# Patient Record
Sex: Female | Born: 1990 | Race: Black or African American | Hispanic: No | Marital: Single | Smoking: Never smoker
Health system: Southern US, Community
[De-identification: ages and names within clinical notes are randomized; demographics above are authoritative.]

## PROBLEM LIST (undated history)

## (undated) DIAGNOSIS — J40 Bronchitis, not specified as acute or chronic: Secondary | ICD-10-CM

---
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
## 2017-03-05 ENCOUNTER — Emergency Department (HOSPITAL_COMMUNITY)
Admission: EM | Admit: 2017-03-05 | Discharge: 2017-03-05 | Disposition: A | Discharge status: Home / Self Care | Payer: BLUE CROSS/BLUE SHIELD | Attending: Emergency Medicine | Admitting: Emergency Medicine

## 2017-03-05 ENCOUNTER — Emergency Department (HOSPITAL_COMMUNITY): Payer: BLUE CROSS/BLUE SHIELD

## 2017-03-05 ENCOUNTER — Encounter (HOSPITAL_COMMUNITY): Payer: Self-pay | Admitting: Emergency Medicine

## 2017-03-05 DIAGNOSIS — Z79899 Other long term (current) drug therapy: Secondary | ICD-10-CM | POA: Insufficient documentation

## 2017-03-05 DIAGNOSIS — M25551 Pain in right hip: Secondary | ICD-10-CM | POA: Diagnosis present

## 2017-03-05 HISTORY — DX: Bronchitis, not specified as acute or chronic: J40

## 2017-03-05 LAB — I-STAT BETA HCG BLOOD, ED (MC, WL, AP ONLY): I-stat hCG, quantitative: <5 m[IU]/mL (ref <5)

## 2017-03-05 MED ORDER — CYCLOBENZAPRINE HCL 10 MG PO TABS: 10.0000 mg | ORAL_TABLET | Freq: Two times a day (BID) | ORAL | 0 refills | Status: AC | PRN

## 2017-03-05 MED ORDER — NAPROXEN 500 MG PO TABS
500.0000 mg | ORAL_TABLET | Freq: Once | ORAL | Status: AC
  Administered 2017-03-05: 500 mg via ORAL
  Filled 2017-03-05: qty 1

## 2017-03-05 MED ORDER — MELOXICAM 7.5 MG PO TABS: 7.5000 mg | ORAL_TABLET | Freq: Every day | ORAL | 0 refills | Status: AC

## 2017-03-05 MED ORDER — CYCLOBENZAPRINE HCL 10 MG PO TABS
5.0000 mg | ORAL_TABLET | Freq: Once | ORAL | Status: AC
  Administered 2017-03-05: 5 mg via ORAL
  Filled 2017-03-05: qty 1

## 2017-03-05 NOTE — ED Notes (Signed)
ED Provider at bedside. 

## 2017-03-05 NOTE — ED Triage Notes (Signed)
Patient complaining of left hip pain. Patient states she stood up and it felt like her hip popped out of place yesterday. She has did stretches and it is still is in pain. Patient has not had any injury to hip.

## 2017-03-05 NOTE — ED Provider Notes (Signed)
[facility]-EMERGENCY DEPT Provider Note   CSN: [id number] Arrival date & time: 03/05/17  1935   History   Chief Complaint Chief Complaint  Patient presents with  . Hip Pain    HPI Gabriella Mckenzie is a 26 y.o. female who presents to the Emergency Department with sudden onset, non-radiating left hip pain that began yesterday afternoon when she changed from a sitting to standing position. She denies hearing a "pop". She reports she was ambulatory after the pain began, but came to the ED for evaluation because she is concerned that her hip might be dislocated. She denies left knee and ankle, right hip pain, fever, chills, back and abdominal pain. She denies a h/o of previous injury to the left hip. No previous surgeries. No chronic health problems or daily medications. NKA. She is a never smoker.   HPI  Past Medical History:  Diagnosis Date  . Bronchitis     There are no active problems to display for this patient.   History reviewed. No pertinent surgical history.  OB History    No data available     Home Medications    Prior to Admission medications   Medication Sig Start Date End Date Taking? Authorizing Provider  cyclobenzaprine (FLEXERIL) 10 MG tablet Take 1 tablet (10 mg total) by mouth 2 (two) times daily as needed for muscle spasms. 03/05/17   Annice Jolly A Linda Biehn, PA-C  meloxicam (MOBIC) 7.5 MG tablet Take 1 tablet (7.5 mg total) by mouth daily. 03/05/17   Teasha Murrillo A Fredick Schlosser, PA-C    Family History History reviewed. No pertinent family history.  Social History Social History  Substance Use Topics  . Smoking status: Never Smoker  . Smokeless tobacco: Never Used  . Alcohol use No   Allergies   Patient has no known allergies.  Review of Systems Review of Systems  Constitutional: Negative for chills and fever.  HENT: Negative for congestion.   Respiratory: Negative for shortness of breath.   Cardiovascular: Negative for chest pain.  Gastrointestinal: Negative for abdominal pain.    Genitourinary: Negative for decreased urine volume and dysuria.  Musculoskeletal: Positive for arthralgias, gait problem and myalgias. Negative for joint swelling.  Allergic/Immunologic: Negative for immunocompromised state.  Neurological: Negative for headaches.  Psychiatric/Behavioral: Negative for confusion.   Physical Exam Updated Vital Signs BP 126/72 (BP Location: Right Arm)   Pulse 88   Temp 99.2 F (37.3 C) (Oral)   Resp 16   Ht 5' 4.75" (1.645 m)   Wt 83.9 kg   LMP 02/06/2017   SpO2 99%   BMI 31.02 kg/m   Physical Exam  Constitutional: She is oriented to person, place, and time. She appears well-developed and well-nourished.  HENT:  Head: Normocephalic and atraumatic.  Neck: Normal range of motion.  Abdominal: Soft.  Musculoskeletal: Normal range of motion. She exhibits tenderness. She exhibits no deformity.       Left hip: She exhibits tenderness. She exhibits normal range of motion, normal strength, no bony tenderness, no swelling, no deformity and no laceration.  Point tenderness to palpation over the Greater Trochanteric bursa on the left. Full ROM of the left and right hips, left knee and left ankle. Neurovascularly intact.   Neurological: She is alert and oriented to person, place, and time. Coordination and gait normal.  Skin: Skin is warm and dry. No rash noted. No erythema.  Nursing note and vitals reviewed.    ED Treatments / Results  Labs (all labs ordered are listed, but only abnormal  results are displayed) Labs Reviewed  I-STAT BETA HCG BLOOD, ED ([facility], [facility], [facility] ONLY)    EKG  EKG Interpretation None       Radiology Dg Hip Unilat With Pelvis 2-3 Views Left  Result Date: 03/05/2017 CLINICAL DATA:  LEFT hip pain, states she she stood up and it felt like it popped out of place, persistent pain despite stretching EXAM: DG HIP (WITH OR WITHOUT PELVIS) 2-3V LEFT COMPARISON:  None FINDINGS: Osseous mineralization normal. Hip and SI joints symmetric and  preserved. No acute fracture, dislocation, or bone destruction. IMPRESSION: Normal exam. Electronically Signed   By: Ulyses Southward M.D.   On: 03/05/2017 21:39    Procedures Procedures (including critical care time)  Medications Ordered in ED Medications  naproxen (NAPROSYN) tablet 500 mg (500 mg Oral Given 03/05/17 2222)  cyclobenzaprine (FLEXERIL) tablet 5 mg (5 mg Oral Given 03/05/17 2222)   Initial Impression / Assessment and Plan / ED Course  I have reviewed the triage vital signs and the nursing notes.  Pertinent labs & imaging results that were available during my care of the patient were reviewed by me and considered in my medical decision making (see chart for details).     Patient with left hip pain. Physical exam consistent with trochanteric bursitis. Patient can walk but states is painful. X-rays are negative for fracture or other acute injury. No concern for septic joint. RICE protocol and antiinflammatory medicine indicated and discussed with patient. [place] and will follow-up with her primary care physician once she returns home.   Final Clinical Impressions(s) / ED Diagnoses   Final diagnoses:  Right hip pain   New Prescriptions Discharge Medication List as of 03/05/2017 10:11 PM    START taking these medications   Details  cyclobenzaprine (FLEXERIL) 10 MG tablet Take 1 tablet (10 mg total) by mouth 2 (two) times daily as needed for muscle spasms., Starting Sun 03/05/2017, Print    meloxicam (MOBIC) 7.5 MG tablet Take 1 tablet (7.5 mg total) by mouth daily., Starting Sun 03/05/2017, Print         Elain Wixon A Marisel Tostenson, PA-C 03/06/17 0140    Vanetta Mulders, MD 03/08/17 [phone]

## 2017-03-05 NOTE — ED Notes (Signed)
Pt ambulatory and independent at discharge.  Verbalized understanding of discharge instructions 

## 2018-07-13 IMAGING — CR DG HIP (WITH OR WITHOUT PELVIS) 2-3V*L*
3 series · 3 of 3 positions shown · non-contrast
Comparison: None

CLINICAL DATA: LEFT hip pain, states she she stood up and it felt
like it popped out of place, persistent pain despite stretching

EXAM:
DG HIP (WITH OR WITHOUT PELVIS) 2-3V LEFT

[t pelvis ap]
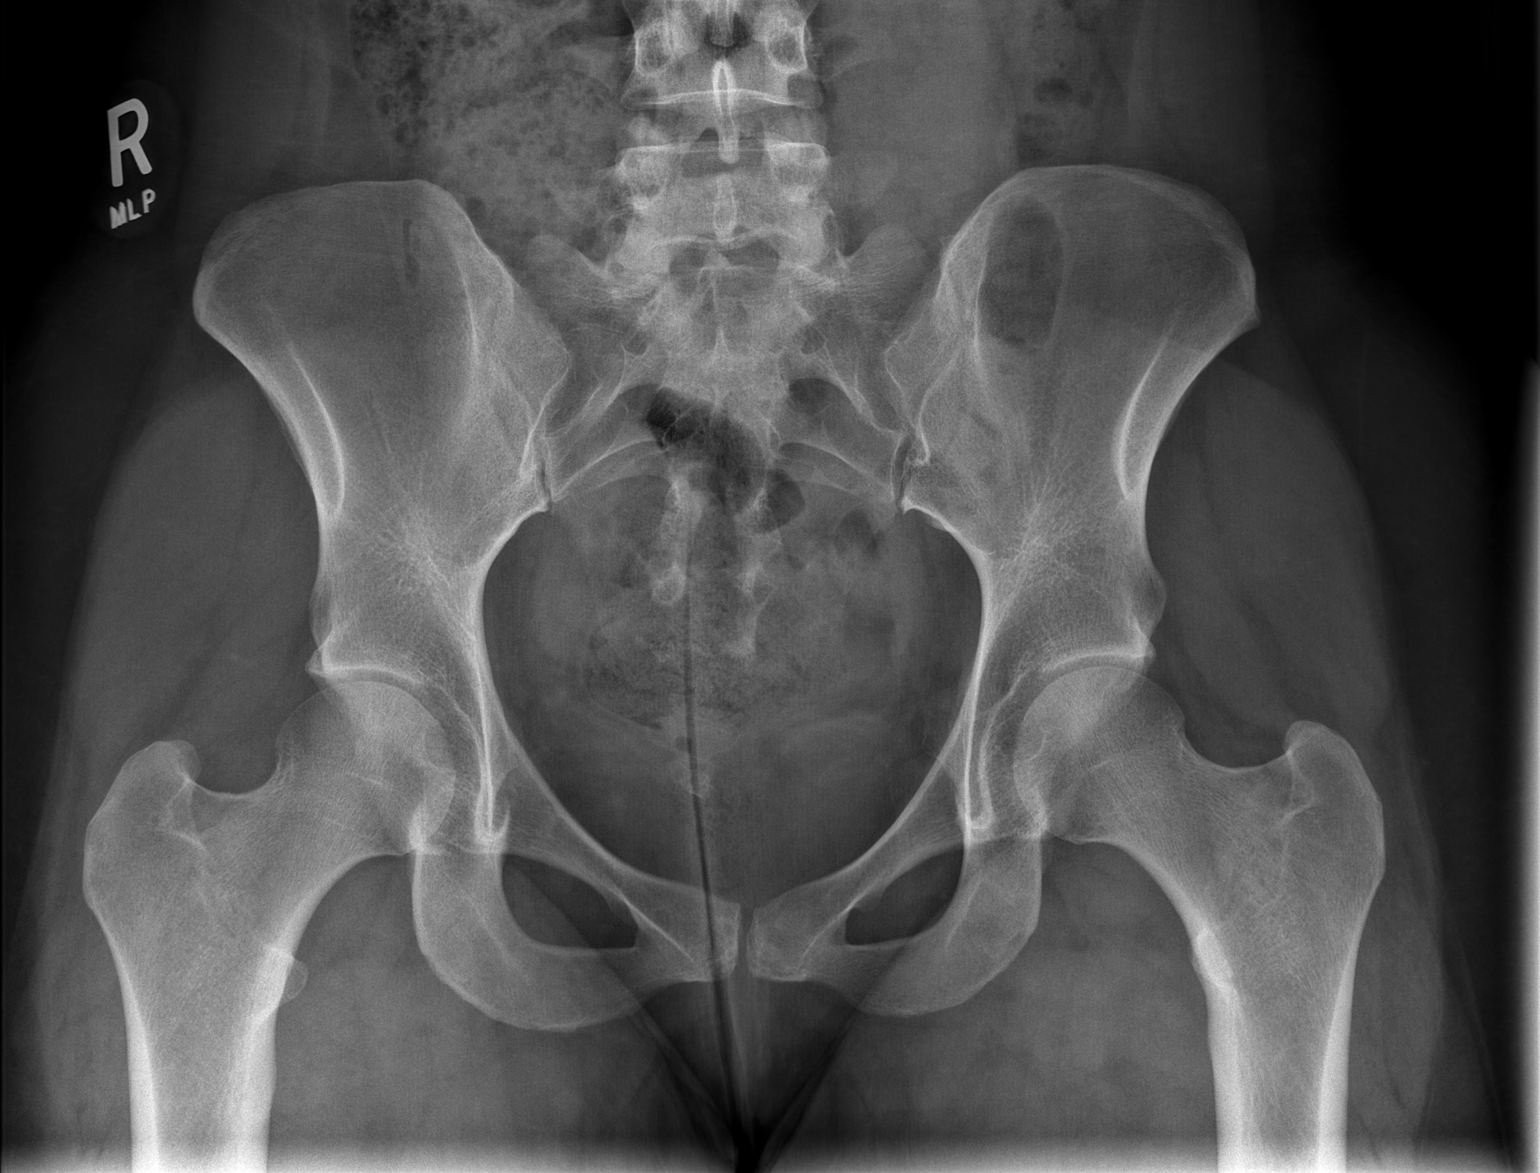

[t hip ap left]
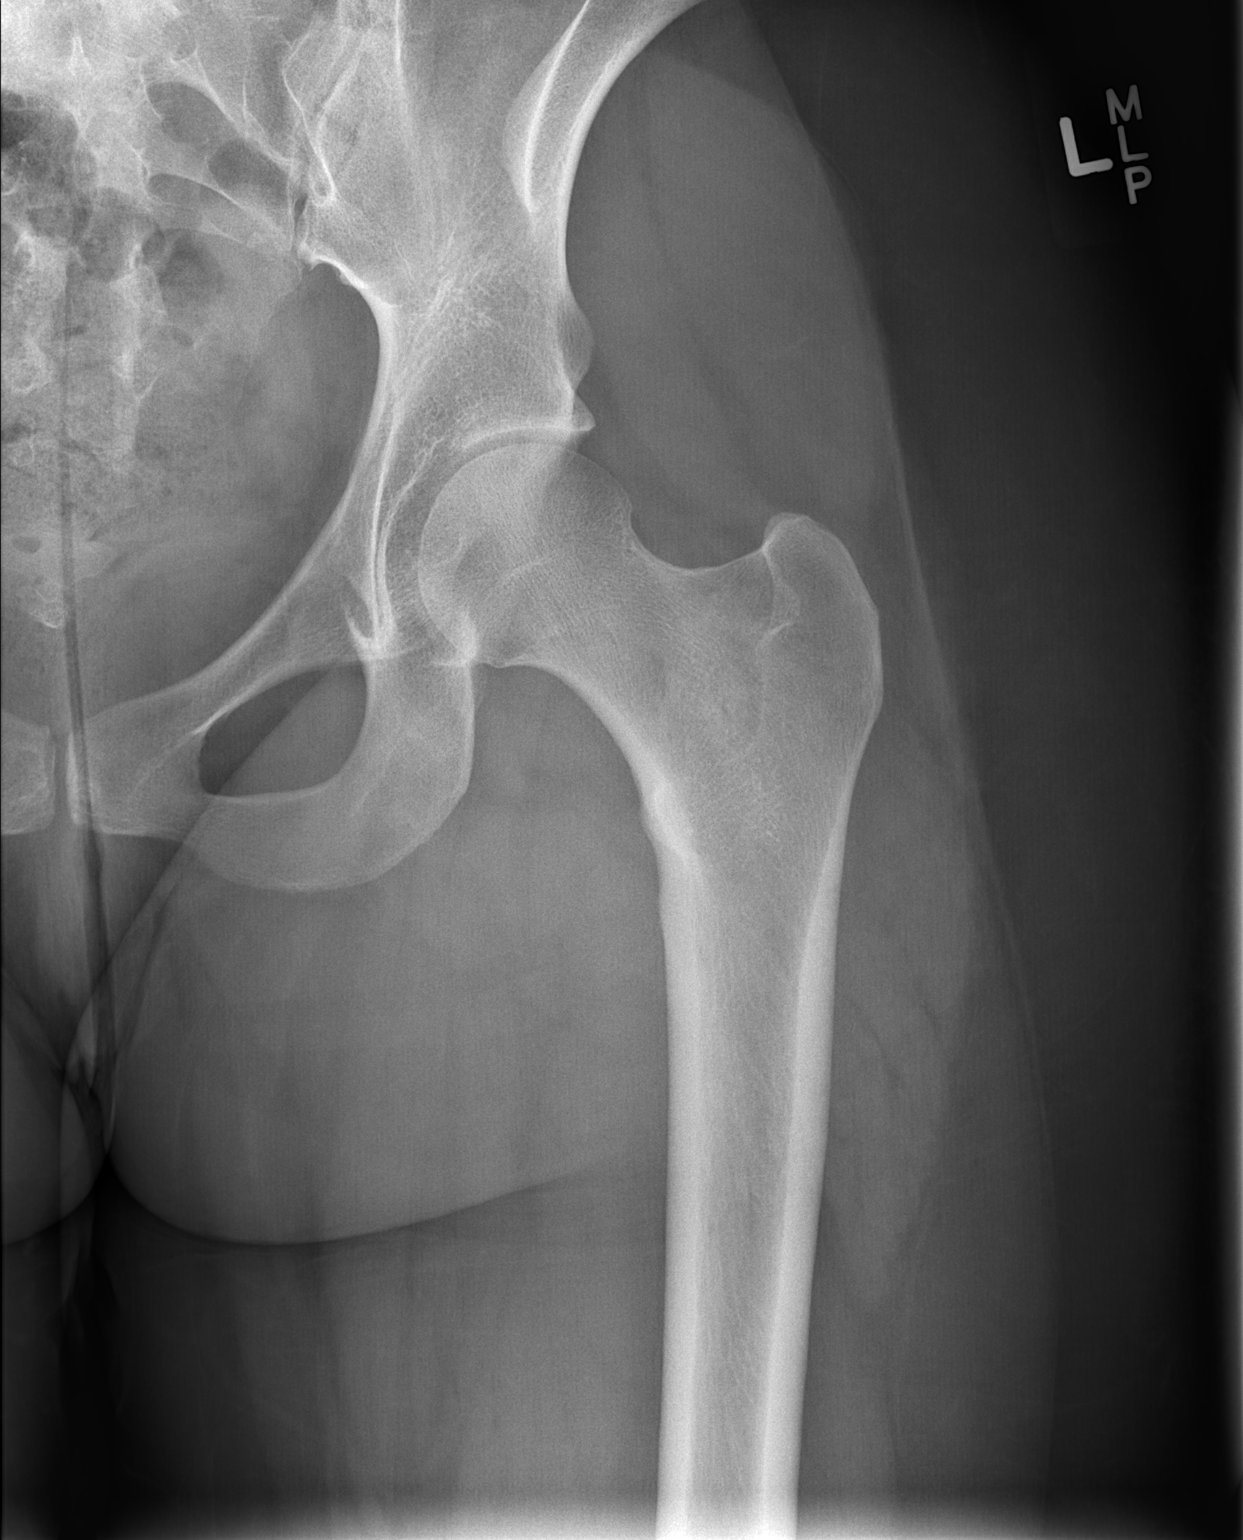

[t hip frog leg left]
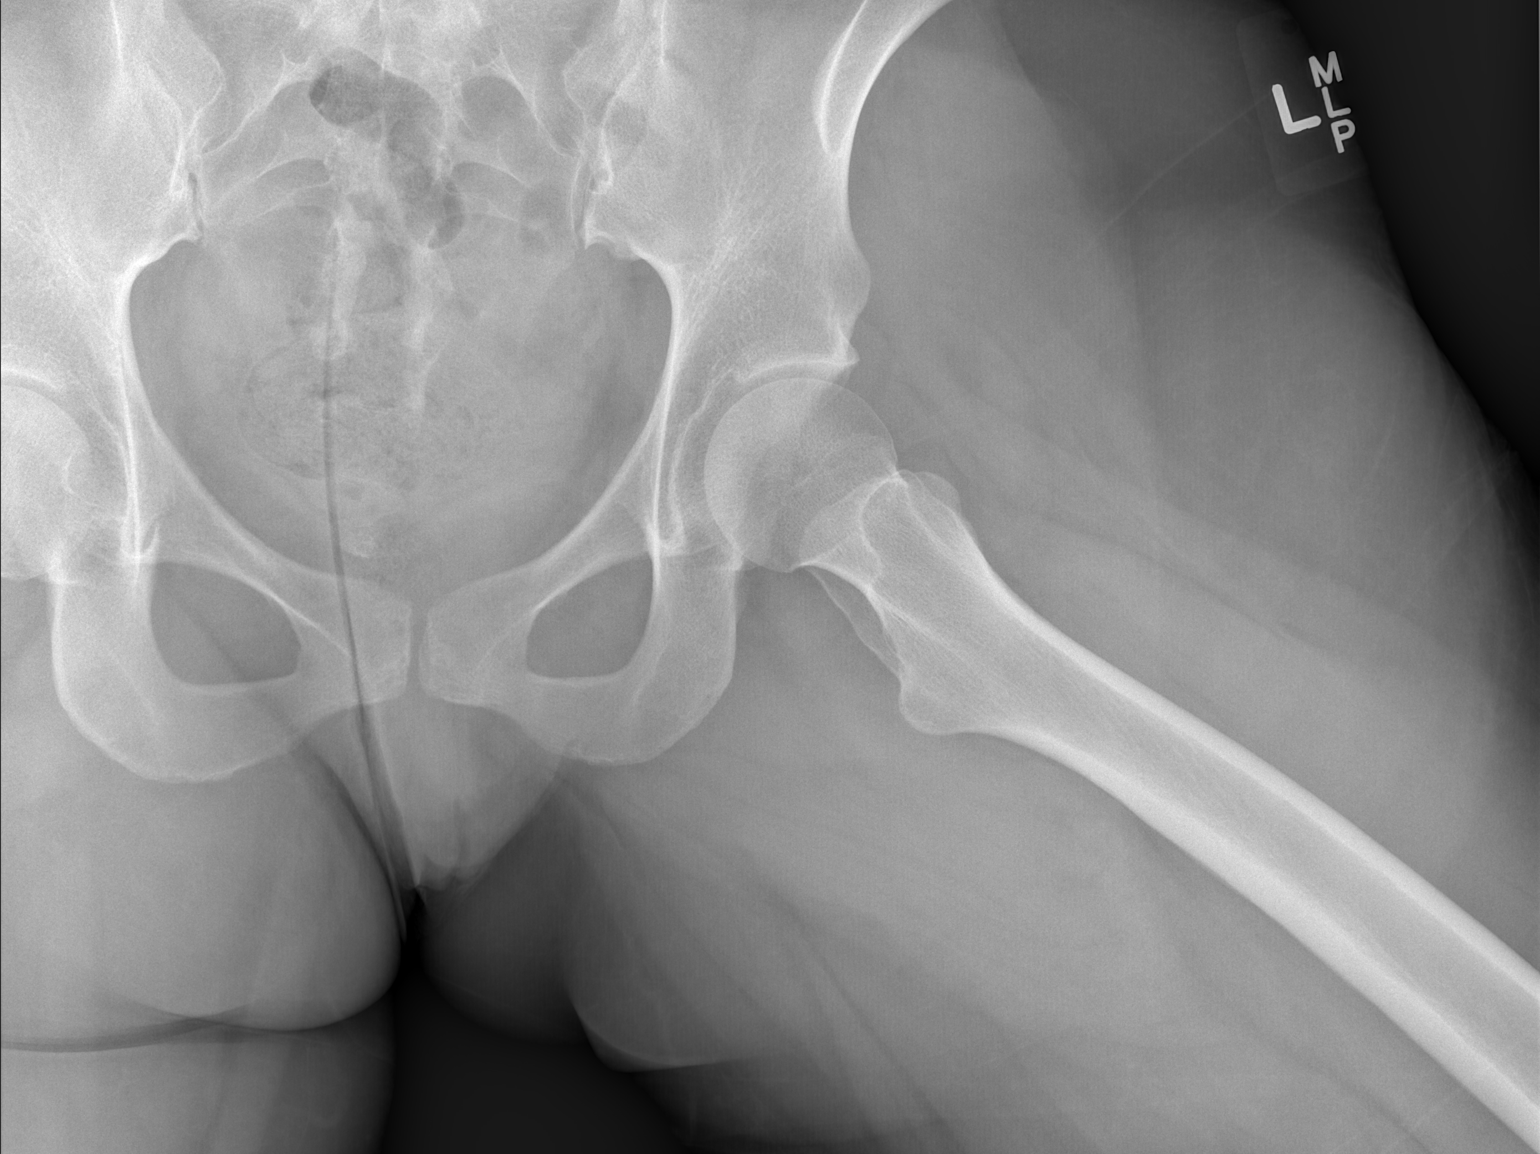

[3 of 3 positions shown; findings below may reference images not displayed]

FINDINGS: Osseous mineralization normal.

Hip and SI joints symmetric and preserved.

No acute fracture, dislocation, or bone destruction.
IMPRESSION: Normal exam.
